# Patient Record
Sex: Male | Born: 1940 | Race: White | Hispanic: No | Marital: Married
Health system: Southern US, Community
[De-identification: ages and names within clinical notes are randomized; demographics above are authoritative.]

---

## 2016-06-16 ENCOUNTER — Emergency Department (HOSPITAL_COMMUNITY)
Admission: EM | Admit: 2016-06-16 | Discharge: 2016-06-16 | Disposition: A | Discharge status: Home / Self Care | Payer: Medicare Other | Attending: Emergency Medicine | Admitting: Emergency Medicine

## 2016-06-16 ENCOUNTER — Emergency Department (HOSPITAL_COMMUNITY): Payer: Medicare Other

## 2016-06-16 ENCOUNTER — Encounter (HOSPITAL_COMMUNITY): Payer: Self-pay | Admitting: Radiology

## 2016-06-16 DIAGNOSIS — Y939 Activity, unspecified: Secondary | ICD-10-CM | POA: Diagnosis not present

## 2016-06-16 DIAGNOSIS — Y999 Unspecified external cause status: Secondary | ICD-10-CM | POA: Insufficient documentation

## 2016-06-16 DIAGNOSIS — Z79899 Other long term (current) drug therapy: Secondary | ICD-10-CM | POA: Diagnosis not present

## 2016-06-16 DIAGNOSIS — Y929 Unspecified place or not applicable: Secondary | ICD-10-CM | POA: Diagnosis not present

## 2016-06-16 DIAGNOSIS — S0990XA Unspecified injury of head, initial encounter: Secondary | ICD-10-CM | POA: Diagnosis not present

## 2016-06-16 DIAGNOSIS — Z7982 Long term (current) use of aspirin: Secondary | ICD-10-CM | POA: Diagnosis not present

## 2016-06-16 DIAGNOSIS — Z23 Encounter for immunization: Secondary | ICD-10-CM | POA: Diagnosis not present

## 2016-06-16 DIAGNOSIS — R51 Headache: Secondary | ICD-10-CM | POA: Diagnosis not present

## 2016-06-16 DIAGNOSIS — W19XXXA Unspecified fall, initial encounter: Secondary | ICD-10-CM

## 2016-06-16 DIAGNOSIS — W06XXXA Fall from bed, initial encounter: Secondary | ICD-10-CM | POA: Diagnosis not present

## 2016-06-16 DIAGNOSIS — S0181XA Laceration without foreign body of other part of head, initial encounter: Secondary | ICD-10-CM | POA: Diagnosis not present

## 2016-06-16 MED ORDER — BACITRACIN ZINC 500 UNIT/GM EX OINT
TOPICAL_OINTMENT | Freq: Once | CUTANEOUS | Status: AC
  Administered 2016-06-16: 1 via TOPICAL
  Filled 2016-06-16: qty 0.9

## 2016-06-16 MED ORDER — TETANUS-DIPHTH-ACELL PERTUSSIS 5-2.5-18.5 LF-MCG/0.5 IM SUSP
0.5000 mL | Freq: Once | INTRAMUSCULAR | Status: AC
Start: 2016-06-16 — End: 2016-06-16
  Administered 2016-06-16: 0.5 mL via INTRAMUSCULAR
  Filled 2016-06-16: qty 0.5

## 2016-06-16 MED ORDER — LIDOCAINE-EPINEPHRINE (PF) 2 %-1:200000 IJ SOLN
20.0000 mL | Freq: Once | INTRAMUSCULAR | Status: AC
  Administered 2016-06-16: 20 mL
  Filled 2016-06-16: qty 20

## 2016-06-16 NOTE — Discharge Instructions (Signed)
It was my pleasure taking care of you today!   Keep wound clean with mild soap and water. Keep area covered with a topical antibiotic ointment and bandage, keep bandage dry, and do not submerge in water for 24 hours. Ice and elevate for additional pain relief and swelling. Alternate between ibuprofen and Tylenol for additional pain relief. Monitor area for signs of infection to include, but not limited to: increasing pain, spreading redness, drainage/pus, worsening swelling, or fevers. Return to emergency department for emergent changing or worsening symptoms.

## 2016-06-16 NOTE — ED Triage Notes (Signed)
Pt wife states that pt fell out of the bed this morning and possibly hit his head on a chair. Pt has a laceration in the middle of his forehead. Bleeding is controlled at this time. Pt takes a daily dose 81 mg of aspirin. Pt AO x4.

## 2016-06-16 NOTE — ED Provider Notes (Signed)
[facility]-EMERGENCY DEPT Provider Note   CSN: [id number] Arrival date & time: 06/16/16  7829     History   Chief Complaint Chief Complaint  Patient presents with  . Fall  . Facial Laceration    HPI Devin Perry is a 76 y.o. male.  The history is provided by the patient and medical records. No language interpreter was used.  Fall  Associated symptoms include headaches.   Reco Devin Perry is a 76 y.o. male  who presents to the Emergency Department for evaluation following a fall. Patient states that he got out of bed this morning to the restroom in tripped, falling face first and striking his head on a nearby chair. He sustained a laceration to the forehead which he immediately put ice on and feels like bleeding has slowed down. He takes an 81 mg aspirin daily, no other anticoagulants. Head hurts at the site of laceration, but no other headache. No neck pain. No loss of consciousness. No weakness, lightheadedness, dizziness. Family at bedside state that he had no episodes of confusion and is at his baseline mental status. Tetanus is not up-to-date. No alleviating or aggravating factors.   History reviewed. No pertinent past medical history.  There are no active problems to display for this patient.   No past surgical history on file.     Home Medications    Prior to Admission medications   Medication Sig Start Date End Date Taking? Authorizing Provider  aspirin EC 81 MG tablet Take 81 mg by mouth daily.   Yes [provider]  Calcium Carb-Cholecalciferol [phone] MG-UNIT CAPS Take 1 tablet by mouth daily.   Yes [provider]  diphenhydrAMINE (BENADRYL) 25 MG tablet Take 25 mg by mouth 2 (two) times daily as needed for allergies.   Yes [provider]  ibuprofen (ADVIL,MOTRIN) 200 MG tablet Take 600 mg by mouth every 6 (six) hours as needed (pain).   Yes [provider]  Multiple Vitamin (MULTIVITAMIN WITH MINERALS) TABS tablet Take 1  tablet by mouth daily.   Yes [provider]  Multiple Vitamins-Minerals (ICAPS PO) Take 1 tablet by mouth daily.   Yes [provider]    Family History No family history on file.  Social History Social History  Substance Use Topics  . Smoking status: Not on file  . Smokeless tobacco: Not on file  . Alcohol use Not on file     Allergies   Patient has no known allergies.   Review of Systems Review of Systems  Skin: Positive for wound.  Neurological: Positive for headaches. Negative for dizziness, syncope, weakness, light-headedness and numbness.  All other systems reviewed and are negative.    Physical Exam Updated Vital Signs BP (!) 176/92 (BP Location: Right Arm)   Pulse 87   Temp 97.8 F (36.6 C) (Oral)   Resp 20   SpO2 95%   Physical Exam  Constitutional: He is oriented to person, place, and time. He appears well-developed and well-nourished. No distress.  HENT:  Head: Normocephalic. Head is without raccoon's eyes and without Battle's sign.  Right Ear: No hemotympanum.  Left Ear: No hemotympanum.  Nose: Nose normal.  Mouth/Throat: Oropharynx is clear and moist.  6 cm laceration to forehead.  Cardiovascular: Normal rate, regular rhythm and normal heart sounds.   No murmur heard. Pulmonary/Chest: Effort normal and breath sounds normal. No respiratory distress.  Abdominal: Soft. He exhibits no distension. There is no tenderness.  Musculoskeletal:  No tenderness  to hips, C/T/L spine or upper/lower extremities.  Neurological: He is alert and oriented to person, place, and time.  Alert, oriented, thought content appropriate, able to give a coherent history. Speech is clear and goal oriented, able to follow commands.  Cranial Nerves:  II:  Peripheral visual fields grossly normal, pupils equal, round, reactive to light III, IV, VI: EOM intact bilaterally, ptosis not present V,VII: smile symmetric, eyes kept closed tightly against resistance,  facial light touch sensation equal VIII: hearing grossly normal IX, X: symmetric soft palate movement, uvula elevates symmetrically  XI: bilateral shoulder shrug symmetric and strong XII: midline tongue extension 5/5 muscle strength in upper and lower extremities bilaterally including strong and equal grip strength and dorsiflexion/plantar flexion Sensory to light touch normal in all four extremities.  Normal finger-to-nose and rapid alternating movements; no drift.  Skin: Skin is warm and dry.  Nursing note and vitals reviewed.    ED Treatments / Results  Labs (all labs ordered are listed, but only abnormal results are displayed) Labs Reviewed - No data to display  EKG  EKG Interpretation None       Radiology Ct Head Wo Contrast  Result Date: 06/16/2016 CLINICAL DATA:  Fall today with headaches, initial encounter EXAM: CT HEAD WITHOUT CONTRAST TECHNIQUE: Contiguous axial images were obtained from the base of the skull through the vertex without intravenous contrast. COMPARISON:  None. FINDINGS: Brain: Mild atrophic changes are noted. No findings to suggest acute hemorrhage, acute infarction or space-occupying mass lesion are seen. Vascular: No hyperdense vessel or unexpected calcification. Skull: Normal. Negative for fracture or focal lesion. Sinuses/Orbits: Mild mucosal thickening is noted within the maxillary antra and ethmoid sinuses. Other: None. IMPRESSION: Chronic atrophic changes.  No acute abnormality is noted. Electronically Signed   By: Alcide CleverMark  Lukens M.D.   On: 06/16/2016 07:59    Procedures Procedures (including critical care time)  LACERATION REPAIR Performed by: Chase PicketJaime Pilcher Ward Consent: Verbal consent obtained. Risks and benefits: risks, benefits and alternatives were discussed Patient identity confirmed: provided demographic data Time out performed prior to procedure Prepped and Draped in normal sterile fashion Wound explored Laceration Location:  Forehead Laceration Length: 6 cm No Foreign Bodies seen or palpated on wound exploration Anesthesia: local infiltration Local anesthetic: lidocaine 2% with epinephrine Anesthetic total: 5 ml Irrigation method: syringe Amount of cleaning: standard Skin closure: 5-0 Vicryl Rapide Number of sutures or staples: 9 Technique: simple interrupted Patient tolerance: Patient tolerated the procedure well with no immediate complications.  Medications Ordered in ED Medications  Tdap (BOOSTRIX) injection 0.5 mL (0.5 mLs Intramuscular Given 06/16/16 0743)  lidocaine-EPINEPHrine (XYLOCAINE W/EPI) 2 %-1:200000 (PF) injection 20 mL (20 mLs Infiltration Given by Other 06/16/16 0841)  bacitracin ointment (1 application Topical Given 06/16/16 0933)     Initial Impression / Assessment and Plan / ED Course  I have reviewed the triage vital signs and the nursing notes.  Pertinent labs & imaging results that were available during my care of the patient were reviewed by me and considered in my medical decision making (see chart for details).    Tennis MustJames A Shell Jr is a 76 y.o. male who presents to ED for mechanical fall, striking his forehead on nearby chair causing laceration. No focal neuro deficits on exam. 81 mg of aspirin, no other anticoagulants. CT head negative. Tetanus updated. Wound thoroughly cleaned in ED today. Wound explored and bottom of wound seen in a bloodless field. Laceration repaired as dictated above. Patient counseled on home wound care.  Head injury return precautions discussed. Signs of infection prompting PCP or ER return discussed. Patient verbalized understanding. All questions answered.   Patient seen by and discussed with Dr. Penne Lash who agrees with treatment plan.    Final Clinical Impressions(s) / ED Diagnoses   Final diagnoses:  Laceration of forehead, initial encounter  Fall, initial encounter  Injury of head, initial encounter    New Prescriptions Discharge Medication List  as of 06/16/2016  9:12 AM       Ward, Chase Picket, PA-C 06/16/16 9147    Shaune Pollack, MD 06/17/16 [phone]

## 2017-05-22 DIAGNOSIS — H2513 Age-related nuclear cataract, bilateral: Secondary | ICD-10-CM | POA: Diagnosis not present

## 2017-05-22 DIAGNOSIS — H25013 Cortical age-related cataract, bilateral: Secondary | ICD-10-CM | POA: Diagnosis not present

## 2017-05-22 DIAGNOSIS — H35033 Hypertensive retinopathy, bilateral: Secondary | ICD-10-CM | POA: Diagnosis not present

## 2017-05-22 DIAGNOSIS — H353132 Nonexudative age-related macular degeneration, bilateral, intermediate dry stage: Secondary | ICD-10-CM | POA: Diagnosis not present

## 2017-08-20 DEATH — deceased

## 2019-01-16 IMAGING — CT CT HEAD W/O CM
3 of 4 series · 14 of 47 positions shown, 16 images · non-contrast
Comparison: None.

CLINICAL DATA: Fall today with headaches, initial encounter

EXAM:
CT HEAD WITHOUT CONTRAST
TECHNIQUE: Contiguous axial images were obtained from the base of the skull
through the vertex without intravenous contrast.

[Series 2: head w/o · axial · non-contrast · 0.45mm/px · z∈[-87,+28]mm · 8 of 29 slices shown, 10 images]
[im 3/29  brain]
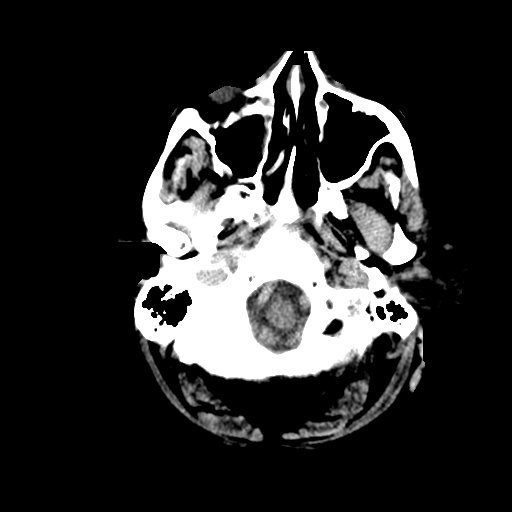
[im 3/29  bone]
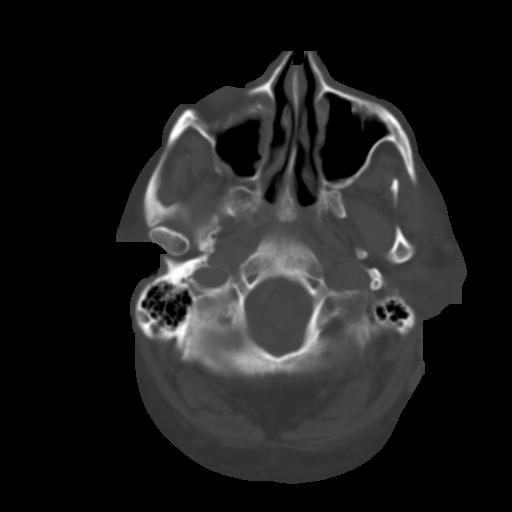
[im 6/29  brain]
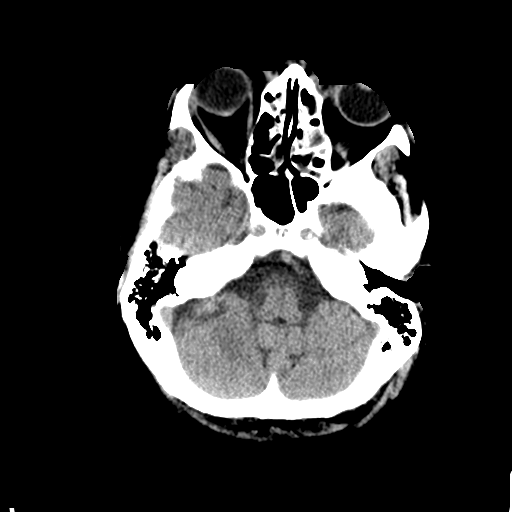
[im 9/29  brain]
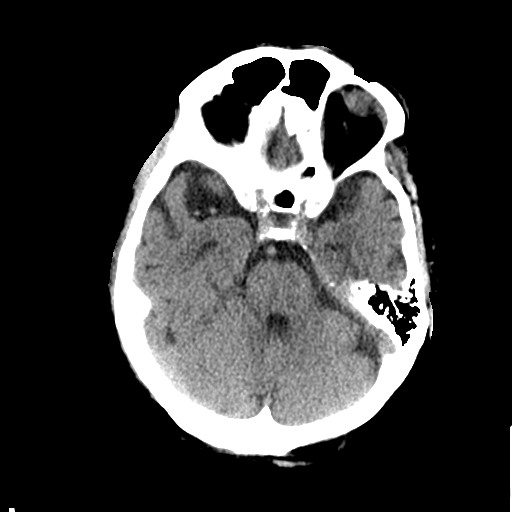
[im 12/29  brain]
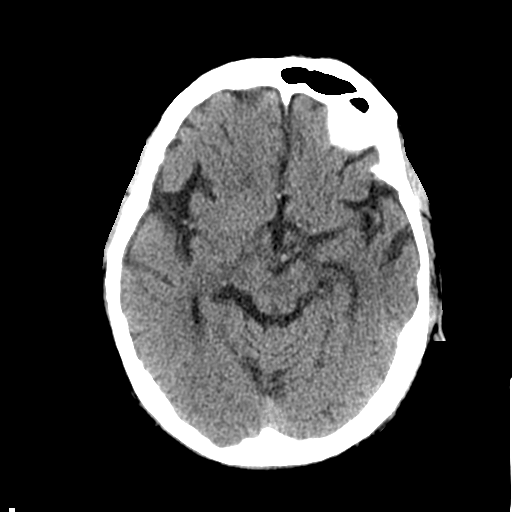
[im 17/29  brain]
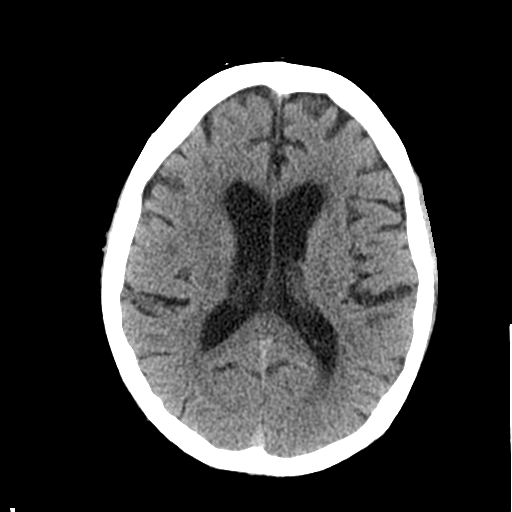
[im 17/29  bone]
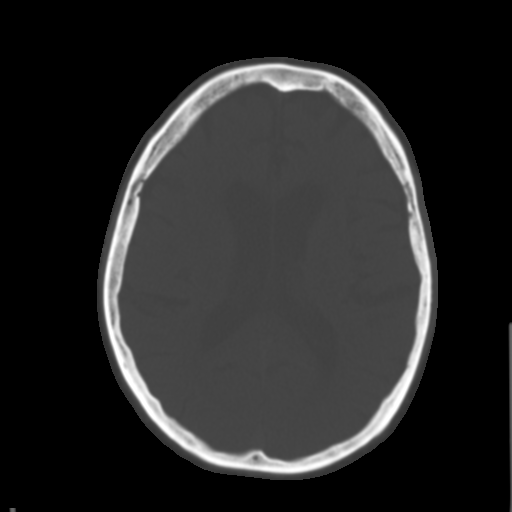
[im 20/29  brain]
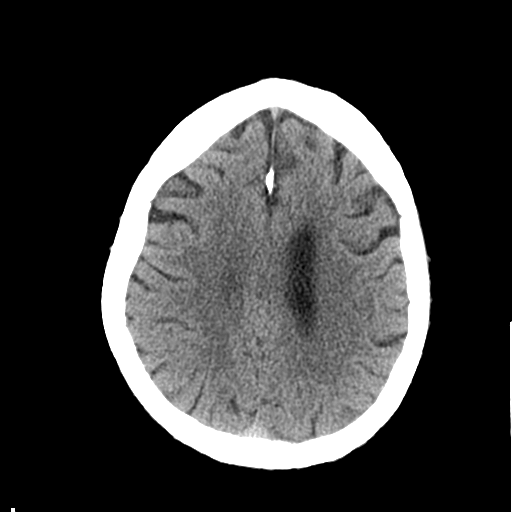
[im 23/29  brain]
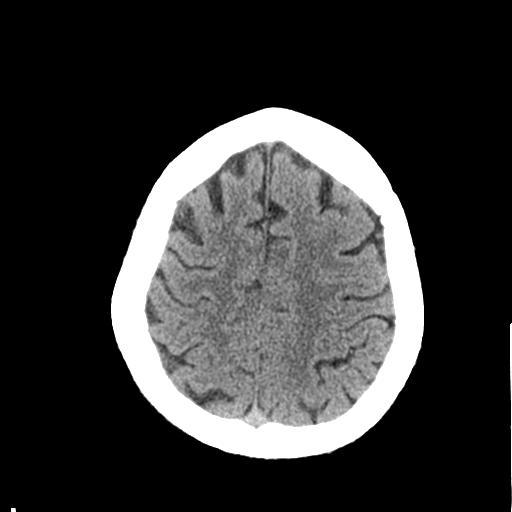
[im 26/29  brain]
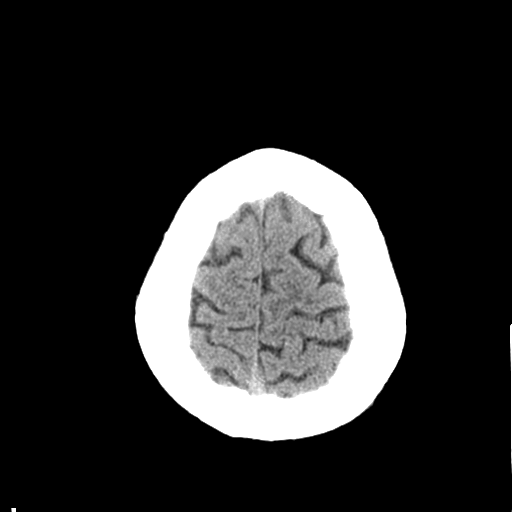

[Series 4: coronal · coronal · 0.30mm/px · 3 of 68 slices shown]
[im 23/68  brain]
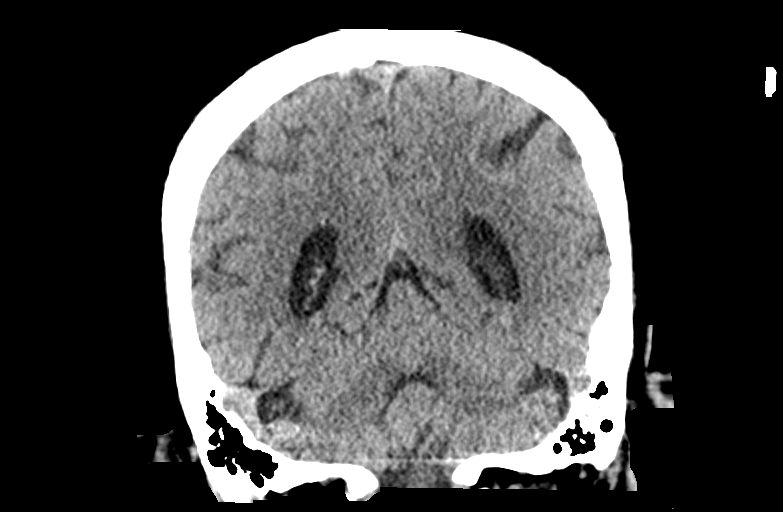
[im 30/68  brain]
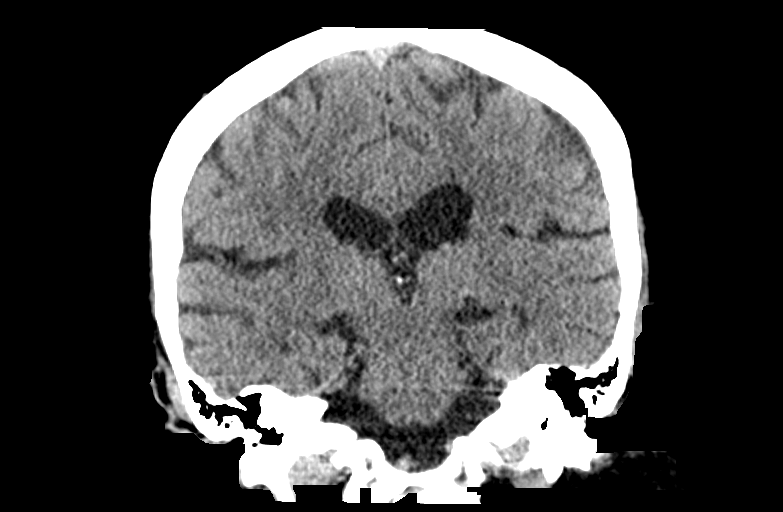
[im 38/68  brain]
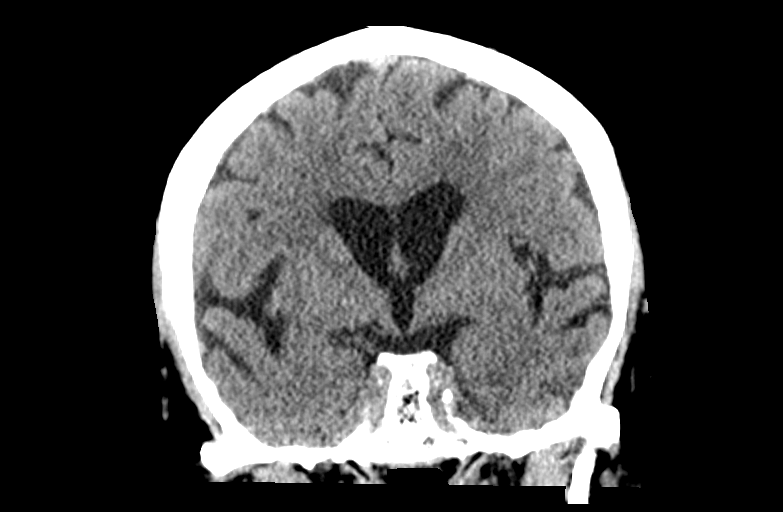

[Series 5: sagittal · sagittal · 0.32mm/px · 3 of 63 slices shown]
[im 21/63  brain]
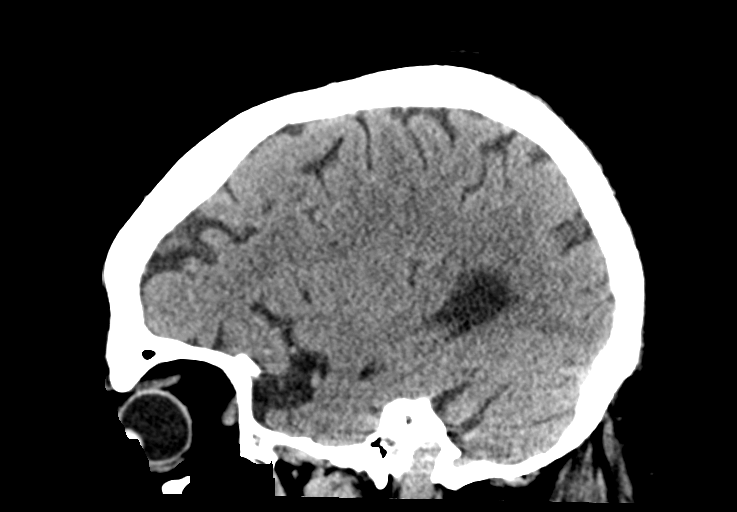
[im 32/63  brain]
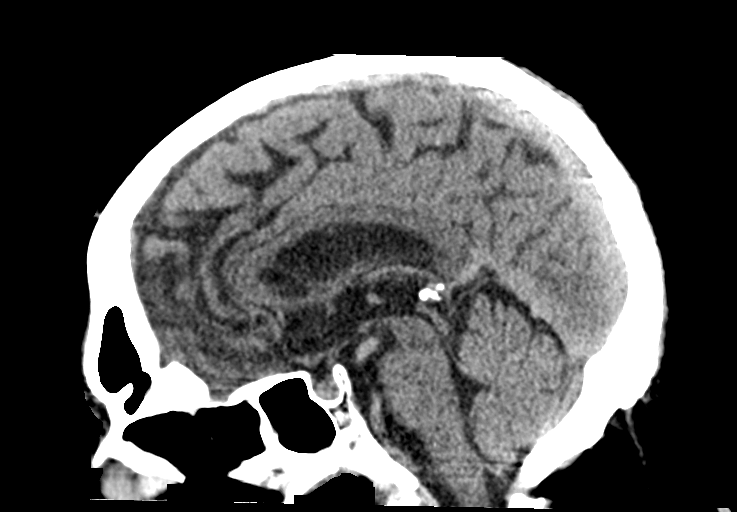
[im 42/63  brain]
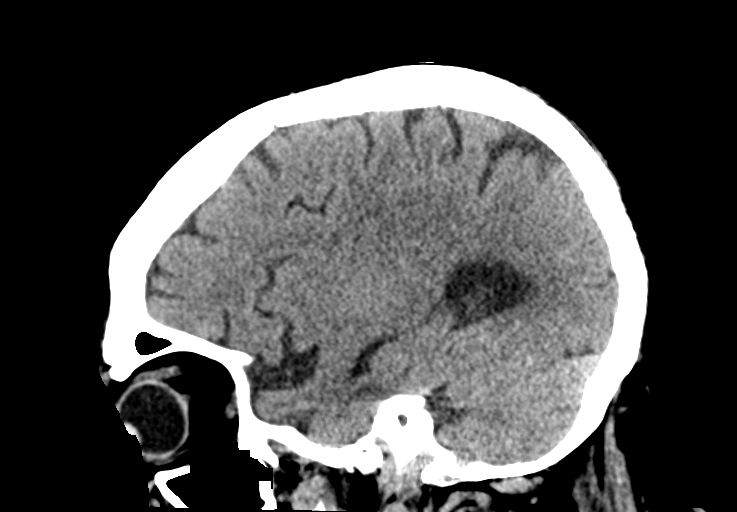

[14 of 47 positions shown; findings below may reference images not displayed]

FINDINGS: Brain: Mild atrophic changes are noted. No findings to suggest acute
hemorrhage, acute infarction or space-occupying mass lesion are
seen.

Vascular: No hyperdense vessel or unexpected calcification.

Skull: Normal. Negative for fracture or focal lesion.

Sinuses/Orbits: Mild mucosal thickening is noted within the
maxillary antra and ethmoid sinuses.

Other: None.
IMPRESSION: Chronic atrophic changes.  No acute abnormality is noted.
